# Patient Record
Sex: Male | Born: 1950 | Race: Black or African American | Hispanic: No | Marital: Married | Smoking: Former smoker
Health system: Southern US, Community
[De-identification: ages and names within clinical notes are randomized; demographics above are authoritative.]

## PROBLEM LIST (undated history)

## (undated) DIAGNOSIS — C099 Malignant neoplasm of tonsil, unspecified: Secondary | ICD-10-CM

## (undated) HISTORY — DX: Malignant neoplasm of tonsil, unspecified: C09.9

---

## 2014-05-14 ENCOUNTER — Ambulatory Visit
Admit: 2014-05-14 | Disposition: A | Discharge status: Home / Self Care | Payer: Self-pay | Attending: Oncology | Admitting: Oncology

## 2014-06-04 ENCOUNTER — Ambulatory Visit
Admit: 2014-06-04 | Disposition: A | Discharge status: Home / Self Care | Payer: Self-pay | Attending: Oncology | Admitting: Oncology

## 2014-06-05 ENCOUNTER — Ambulatory Visit
Admit: 2014-06-05 | Disposition: A | Discharge status: Home / Self Care | Payer: Self-pay | Attending: Oncology | Admitting: Oncology

## 2014-06-11 ENCOUNTER — Ambulatory Visit
Admit: 2014-06-11 | Disposition: A | Discharge status: Home / Self Care | Payer: Self-pay | Attending: Gastroenterology | Admitting: Gastroenterology

## 2014-06-24 ENCOUNTER — Ambulatory Visit: Admit: 2014-06-24 | Disposition: A | Discharge status: Home / Self Care | Payer: Self-pay | Admitting: Gastroenterology

## 2014-07-02 ENCOUNTER — Other Ambulatory Visit: Payer: Self-pay | Admitting: Oncology

## 2014-07-02 DIAGNOSIS — C099 Malignant neoplasm of tonsil, unspecified: Secondary | ICD-10-CM

## 2014-07-08 ENCOUNTER — Other Ambulatory Visit: Payer: Self-pay | Admitting: Family Medicine

## 2014-07-08 DIAGNOSIS — C4492 Squamous cell carcinoma of skin, unspecified: Secondary | ICD-10-CM

## 2014-07-09 ENCOUNTER — Other Ambulatory Visit: Payer: Self-pay | Admitting: Family Medicine

## 2014-07-09 ENCOUNTER — Ambulatory Visit: Payer: Federal, State, Local not specified - PPO

## 2014-07-09 DIAGNOSIS — C4492 Squamous cell carcinoma of skin, unspecified: Secondary | ICD-10-CM

## 2014-07-10 ENCOUNTER — Other Ambulatory Visit: Payer: Self-pay | Admitting: *Deleted

## 2014-07-10 DIAGNOSIS — C760 Malignant neoplasm of head, face and neck: Secondary | ICD-10-CM

## 2014-07-13 ENCOUNTER — Ambulatory Visit: Payer: Self-pay | Admitting: Oncology

## 2014-07-13 ENCOUNTER — Other Ambulatory Visit: Payer: Self-pay

## 2014-07-17 ENCOUNTER — Telehealth: Payer: Self-pay | Admitting: *Deleted

## 2014-07-17 NOTE — Telephone Encounter (Signed)
Patient stated that had recent studies done at [facility] that showed liver spot. Was questioning if the PET scan would show this spot to be cancerous. Informed pt that the PET scan would reveal if this spot is malignant or not. Pt stated keep appointment for the scan and will follow up with our clinic on Tuesday.

## 2014-07-21 ENCOUNTER — Ambulatory Visit
Admission: RE | Admit: 2014-07-21 | Discharge: 2014-07-21 | Disposition: A | Discharge status: Home / Self Care | Payer: Federal, State, Local not specified - PPO | Source: Ambulatory Visit | Attending: Family Medicine | Admitting: Family Medicine

## 2014-07-21 DIAGNOSIS — C801 Malignant (primary) neoplasm, unspecified: Secondary | ICD-10-CM | POA: Diagnosis present

## 2014-07-21 LAB — GLUCOSE, CAPILLARY: GLUCOSE-CAPILLARY: 107 mg/dL — AB (ref 65–99)

## 2014-07-21 MED ORDER — FLUDEOXYGLUCOSE F - 18 (FDG) INJECTION
12.2800 | Freq: Once | INTRAVENOUS | Status: AC | PRN
  Administered 2014-07-21: 12.28 via INTRAVENOUS

## 2014-07-23 ENCOUNTER — Encounter (INDEPENDENT_AMBULATORY_CARE_PROVIDER_SITE_OTHER): Payer: Self-pay

## 2014-07-23 ENCOUNTER — Inpatient Hospital Stay: Payer: Federal, State, Local not specified - PPO | Attending: Oncology

## 2014-07-23 ENCOUNTER — Inpatient Hospital Stay (HOSPITAL_BASED_OUTPATIENT_CLINIC_OR_DEPARTMENT_OTHER): Payer: Federal, State, Local not specified - PPO | Admitting: Oncology

## 2014-07-23 DIAGNOSIS — Z87891 Personal history of nicotine dependence: Secondary | ICD-10-CM

## 2014-07-23 DIAGNOSIS — Z800 Family history of malignant neoplasm of digestive organs: Secondary | ICD-10-CM | POA: Diagnosis not present

## 2014-07-23 DIAGNOSIS — Z79899 Other long term (current) drug therapy: Secondary | ICD-10-CM | POA: Insufficient documentation

## 2014-07-23 DIAGNOSIS — Z9221 Personal history of antineoplastic chemotherapy: Secondary | ICD-10-CM

## 2014-07-23 DIAGNOSIS — R160 Hepatomegaly, not elsewhere classified: Secondary | ICD-10-CM

## 2014-07-23 DIAGNOSIS — Z85858 Personal history of malignant neoplasm of other endocrine glands: Secondary | ICD-10-CM

## 2014-07-23 DIAGNOSIS — I10 Essential (primary) hypertension: Secondary | ICD-10-CM | POA: Insufficient documentation

## 2014-07-23 DIAGNOSIS — B1920 Unspecified viral hepatitis C without hepatic coma: Secondary | ICD-10-CM | POA: Insufficient documentation

## 2014-07-23 DIAGNOSIS — Z923 Personal history of irradiation: Secondary | ICD-10-CM | POA: Insufficient documentation

## 2014-07-23 DIAGNOSIS — Z85819 Personal history of malignant neoplasm of unspecified site of lip, oral cavity, and pharynx: Secondary | ICD-10-CM | POA: Insufficient documentation

## 2014-07-23 DIAGNOSIS — R945 Abnormal results of liver function studies: Secondary | ICD-10-CM | POA: Insufficient documentation

## 2014-07-23 DIAGNOSIS — K769 Liver disease, unspecified: Secondary | ICD-10-CM

## 2014-07-23 DIAGNOSIS — C099 Malignant neoplasm of tonsil, unspecified: Secondary | ICD-10-CM

## 2014-07-23 DIAGNOSIS — C760 Malignant neoplasm of head, face and neck: Secondary | ICD-10-CM

## 2014-07-23 LAB — COMPREHENSIVE METABOLIC PANEL
ALBUMIN: 4.2 g/dL (ref 3.5–5.0)
ALT: 78 U/L — ABNORMAL HIGH (ref 17–63)
ANION GAP: 4 — AB (ref 5–15)
AST: 59 U/L — ABNORMAL HIGH (ref 15–41)
Alkaline Phosphatase: 62 U/L (ref 38–126)
BUN: 12 mg/dL (ref 6–20)
CALCIUM: 9.2 mg/dL (ref 8.9–10.3)
CO2: 28 mmol/L (ref 22–32)
Chloride: 106 mmol/L (ref 101–111)
Creatinine, Ser: 0.98 mg/dL (ref 0.61–1.24)
GFR calc Af Amer: >60 mL/min (ref >60)
GFR calc non Af Amer: >60 mL/min (ref >60)
GLUCOSE: 104 mg/dL — AB (ref 65–99)
Potassium: 4.1 mmol/L (ref 3.5–5.1)
SODIUM: 138 mmol/L (ref 135–145)
TOTAL PROTEIN: 7.5 g/dL (ref 6.5–8.1)
Total Bilirubin: 1 mg/dL (ref 0.3–1.2)

## 2014-07-23 LAB — CBC WITH DIFFERENTIAL/PLATELET
Basophils Absolute: 0 10*3/uL (ref 0–0.1)
Basophils Relative: 1 %
EOS ABS: 0.1 10*3/uL (ref 0–0.7)
EOS PCT: 2 %
HCT: 49.8 % (ref 40.0–52.0)
HEMOGLOBIN: 16.7 g/dL (ref 13.0–18.0)
LYMPHS ABS: 0.8 10*3/uL — AB (ref 1.0–3.6)
LYMPHS PCT: 20 %
MCH: 31.9 pg (ref 26.0–34.0)
MCHC: 33.6 g/dL (ref 32.0–36.0)
MCV: 94.9 fL (ref 80.0–100.0)
Monocytes Absolute: 0.7 10*3/uL (ref 0.2–1.0)
Monocytes Relative: 17 %
Neutro Abs: 2.5 10*3/uL (ref 1.4–6.5)
Neutrophils Relative %: 60 %
PLATELETS: 129 10*3/uL — AB (ref 150–440)
RBC: 5.25 MIL/uL (ref 4.40–5.90)
RDW: 13.3 % (ref 11.5–14.5)
WBC: 4.1 10*3/uL (ref 3.8–10.6)

## 2014-07-24 ENCOUNTER — Encounter: Payer: Self-pay | Admitting: Oncology

## 2014-07-24 DIAGNOSIS — C099 Malignant neoplasm of tonsil, unspecified: Secondary | ICD-10-CM

## 2014-07-24 HISTORY — DX: Malignant neoplasm of tonsil, unspecified: C09.9

## 2014-07-24 NOTE — Progress Notes (Signed)
[place] @ [facility] Telephone:(336) [phone]  Fax:(336) [phone]     Walter Mays OB: 1950-03-11  MR#: [id number]  EYC#:[id number]  Patient Care Team: Arlis Porta., MD as PCP - General (Family Medicine)  CHIEF COMPLAINT: No chief complaint on file.   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  Treated initially with 1 cycle of Taxotere, cis-platinum, 5-FU by continuous infusion.  ([place], 2006) because of side effect patient went to [place] where patient underwent radical neck dissection with radiation therapy.  (Patient also received  CETUXAMAB  with radiation treatment) 2.  Routine evaluation in 2014  revealed   right   TONSILLAR abnormality, patient underwent robotic resection.  Exact stage of this cancer is not known. 3.  May of 2016.  Liver mass.  Low hypermetabolic. AFP OF more than 1400 suggestive of hepatocellular cancer.  Radiofrequency ablation is being planned     Carcinoma of tonsil   07/24/2014 Initial Diagnosis Carcinoma of tonsil    No flowsheet data found.  INTERVAL HISTORY: 64 year old gentleman was being followed by multiple physician.  Recently recent had evaluation with GI physician because of patient's hepatitis C routines ultrasound followed by MRI scan of liver was done.  A liver mass was found.  Patient had AFB of more than 1400 and patient was referred to [facility] for radiofrequency ablation.  Patient recently had developed PET scan done for the restaging of head and neck cancer and here to discuss the results and further planning of treatment.  Patient did not have colonoscopy in last few years.  Appetite remains stable.  No abdominal pain.  REVIEW OF SYSTEMS:   GENERAL:  Feels good.  Active.  No fevers, sweats or weight loss. PERFORMANCE STATUS (ECOG): 0 HEENT:  No visual changes, runny nose, sore throat, mouth sores or tenderness. Lungs: No  shortness of breath or cough.  No hemoptysis. Cardiac:  No chest pain, palpitations, orthopnea, or PND. GI:  No nausea, vomiting, diarrhea, constipation, melena or hematochezia. GU:  No urgency, frequency, dysuria, or hematuria. Musculoskeletal:  No back pain.  No joint pain.  No muscle tenderness. Extremities:  No pain or swelling. Skin:  No rashes or skin changes. Neuro:  No headache, numbness or weakness, balance or coordination issues. Endocrine:  No diabetes, thyroid issues, hot flashes or night sweats. Psych:  No mood changes, depression or anxiety. Pain:  No focal pain. Review of systems:  All other systems reviewed and found to be negative.  As per HPI. Otherwise, a complete review of systems is negatve.  PAST MEDICAL HISTORY: Past Medical History  Diagnosis Date  . Carcinoma of tonsil 07/24/2014    PAST history. Significant History/PMH:   head and neck cancer:    Hypertension:  Hepatitis C  Smoking History: Smoking History Never Smoked.  PFSH: Comments: colon cancer in the family.  History of diabetes in the family.  Social History: negative alcohol, negative tobacco  Additional Past Medical and Surgical History: History of hypertension  Unspecified liver disease (history of hepatitis)  Kidney stones    No significant family history     ADVANCED DIRECTIVES: has advance care directives   HEALTH MAINTENANCE: History  Substance Use Topics  . Smoking status: Former Research scientist (life sciences)  . Smokeless tobacco: Not on file  . Alcohol Use: Not on file     Colonoscopy:  PAP:  Bone density:  Lipid panel:  Allergies  Allergen Reactions  .  Ultracet [Tramadol-Acetaminophen] Itching    Current Outpatient Prescriptions  Medication Sig Dispense Refill  . amLODipine (NORVASC) 5 MG tablet     . sertraline (ZOLOFT) 50 MG tablet      No current facility-administered medications for this visit.    OBJECTIVE:  There were no vitals filed for this visit.   There is no  weight on file to calculate BMI.    ECOG FS:0 - Asymptomatic  PHYSICAL EXAM: GENERAL:  Well developed, well nourished, sitting comfortably in the exam room in no acute distress. MENTAL STATUS:  Alert and oriented to person, place and time. HEA Normocephalic, atraumatic, face symmetric, no Cushingoid features. EYES:Pupils equal round and reactive to light and accomodation.  No conjunctivitis or scleral icterus. ENT:  Oropharynx clear without lesion.  Tongue normal. Mucous membranes moist.  RESPIRATORY:  Clear to auscultation without rales, wheezes or rhonchi. CARDIOVASCULAR:  Regular rate and rhythm without murmur, rub or gallop. BREAST:  Right breast without masses, skin changes or nipple discharge.  Left breast without masses, skin changes or nipple discharge. ABDOMEN:  Soft, non-tender, with active bowel sounds, and no hepatosplenomegaly.  No masses. BACK:  No CVA tenderness.  No tenderness on percussion of the back or rib cage. SKIN:  No rashes, ulcers or lesions. EXTREMITIES: No edema, no skin discoloration or tenderness.  No palpable cords. LYMPH NODES: No palpable cervical, supraclavicular, axillary or inguinal adenopathy  NEUROLOGICAL: Unremarkable. PSYCH:  Appropriate.   LAB RESULTS:  Clinical Support on 07/23/2014  Component Date Value Ref Range Status  . WBC 07/23/2014 4.1  3.8 - 10.6 K/uL Final  . RBC 07/23/2014 5.25  4.40 - 5.90 MIL/uL Final  . Hemoglobin 07/23/2014 16.7  13.0 - 18.0 g/dL Final  . HCT 07/23/2014 49.8  40.0 - 52.0 % Final  . MCV 07/23/2014 94.9  80.0 - 100.0 fL Final  . MCH 07/23/2014 31.9  26.0 - 34.0 pg Final  . MCHC 07/23/2014 33.6  32.0 - 36.0 g/dL Final  . RDW 07/23/2014 13.3  11.5 - 14.5 % Final  . Platelets 07/23/2014 129* 150 - 440 K/uL Final  . Neutrophils Relative % 07/23/2014 60   Final  . Neutro Abs 07/23/2014 2.5  1.4 - 6.5 K/uL Final  . Lymphocytes Relative 07/23/2014 20   Final  . Lymphs Abs 07/23/2014 0.8* 1.0 - 3.6 K/uL Final  .  Monocytes Relative 07/23/2014 17   Final  . Monocytes Absolute 07/23/2014 0.7  0.2 - 1.0 K/uL Final  . Eosinophils Relative 07/23/2014 2   Final  . Eosinophils Absolute 07/23/2014 0.1  0 - 0.7 K/uL Final  . Basophils Relative 07/23/2014 1   Final  . Basophils Absolute 07/23/2014 0.0  0 - 0.1 K/uL Final  . Sodium 07/23/2014 138  135 - 145 mmol/L Final  . Potassium 07/23/2014 4.1  3.5 - 5.1 mmol/L Final  . Chloride 07/23/2014 106  101 - 111 mmol/L Final  . CO2 07/23/2014 28  22 - 32 mmol/L Final  . Glucose, Bld 07/23/2014 104* 65 - 99 mg/dL Final  . BUN 07/23/2014 12  6 - 20 mg/dL Final  . Creatinine, Ser 07/23/2014 0.98  0.61 - 1.24 mg/dL Final  . Calcium 07/23/2014 9.2  8.9 - 10.3 mg/dL Final  . Total Protein 07/23/2014 7.5  6.5 - 8.1 g/dL Final  . Albumin 07/23/2014 4.2  3.5 - 5.0 g/dL Final  . AST 07/23/2014 59* 15 - 41 U/L Final  . ALT 07/23/2014 78* 17 - 63 U/L Final  .  Alkaline Phosphatase 07/23/2014 62  38 - 126 U/L Final  . Total Bilirubin 07/23/2014 1.0  0.3 - 1.2 mg/dL Final  . GFR calc non Af Amer 07/23/2014 >60  >60 mL/min Final  . GFR calc Af Amer 07/23/2014 >60  >60 mL/min Final   Comment: (NOTE) The eGFR has been calculated using the CKD EPI equation. This calculation has not been validated in all clinical situations. eGFR's persistently <60 mL/min signify possible Chronic Kidney Disease.   Georgiann Hahn gap 07/23/2014 4* 5 - 15 Final  Hospital Outpatient Visit on 07/21/2014  Component Date Value Ref Range Status  . Glucose-Capillary 07/21/2014 107* 65 - 99 mg/dL Final    No results found for: LABCA2 No results found for: CA199 No results found for: CEA No results found for: PSA No results found for: CA125   STUDIES: Mr Abdomen W Wo Contrast  06/24/2014   CLINICAL DATA:  Subsequent encounter for liver lesion  EXAM: MRI ABDOMEN WITHOUT AND WITH CONTRAST  TECHNIQUE: Multiplanar multisequence MR imaging of the abdomen was performed both before and after the  administration of intravenous contrast.  CONTRAST:  18 cc MultiHance  COMPARISON:  Ultrasound exam 06/11/2014.  FINDINGS: Lower chest:  Unremarkable.  Hepatobiliary: Heterogeneously enhancing 4.1 x 3.5 x 4.0 cm masses identified in the inferior liver (segment V). This corresponds to the abnormality seen on the recent ultrasound exam. The lesion exists in 8 liver which shows nodular enhancement in atrophy of the left hepatic lobe. Left hepatic lobe appears more nodular than the right liver. Multiple hyper enhancing tiny foci are seen scattered throughout both lobes.  Gallbladder is distended with some dependent stones measuring in the 5-10 mm size range. No pericholecystic fluid or gallbladder wall thickening. No intrahepatic or extrahepatic biliary dilation.  Pancreas: No focal mass lesion. No dilatation of the main duct. No intraparenchymal cyst. No peripancreatic edema.  Spleen: No splenomegaly. No focal mass lesion.  Adrenals/Urinary Tract: No adrenal nodule or mass. Kidneys are unremarkable.  Stomach/Bowel: Stomach is nondistended. No gastric wall thickening. No evidence of outlet obstruction. Duodenum is normally positioned as is the ligament of Treitz.  Vascular/Lymphatic: No abdominal aortic aneurysm. No gastrohepatic or hepatoduodenal ligament lymphadenopathy. No retroperitoneal lymphadenopathy.  Other: No intraperitoneal free fluid in the visualized abdomen.  Musculoskeletal: No abnormal marrow signal within the visualized bony anatomy.  IMPRESSION: 4.0 cm heterogeneously enhancing lesion in the inferior right liver. Given the hepatic appearance suggesting underlying cirrhosis in this patient with a history of hepatitis-C, hepatoma is a distinct concern. Other scattered tiny (5-30m) foci of hyper enhancement raise concern for multi focal disease.  Cholelithiasis.   Electronically Signed   By: EMisty StanleyM.D.   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 Liver lesion on MRI.  EXAM: NUCLEAR MEDICINE PET SKULL BASE TO THIGH  TECHNIQUE: 12.3 mCi F-18 FDG was injected intravenously. Full-ring PET imaging was performed from the skull base to thigh after the radiotracer. CT data was obtained and used for attenuation correction and anatomic localization.  FASTING BLOOD GLUCOSE:  Value: 107 mg/dl  COMPARISON:  None.  FINDINGS: NECK  No hypermetabolic lymph nodes in the neck.  CHEST  Lungs are clear. No suspicious pulmonary nodules. Mild dependent atelectasis in the bilateral lower lobes.  Heart is normal in size.  No suspicious thoracic lymphadenopathy.  ABDOMEN/PELVIS  Although poorly visualized on unenhanced  CT, the known lesion in the anterior right liver measures approximately 3.9 x 3.8 cm (series 3/image 128). No associated hypermetabolism above background liver on FDG PET. Underlying cirrhosis.  No abnormal hypermetabolic activity within the pancreas, adrenal glands, or spleen.  Cholelithiasis, without associated inflammatory changes.  No hypermetabolic lymph nodes in the abdomen or pelvis.  SKELETON  No focal hypermetabolic activity to suggest skeletal metastasis.  IMPRESSION: Known liver lesion in the anterior right hepatic lobe does not demonstrate hypermetabolism above that of hepatic background. While this does not exclude low grade hepatocellular carcinoma, the appearance is not suggestive of metastatic disease. Consider percutaneous sampling.  Otherwise, no evidence of metastatic disease in the neck, chest, abdomen, or pelvis.   Electronically Signed   By: Julian Hy M.D.   On: 07/21/2014 11:18    ASSESSMENT: Carcinoma of tonsils and previous history of carcinoma metastases to lymph node (squamous cell) cervical lymph node unknown primary treated at outside institution.  PET scan has been done and has been reviewed independently there is no evidence of recurrent head and neck  cancer 2.  History of hepatitis C and recent MRI scan of liver sows liver mass with high alpha-fetoprotein  MEDICAL DECISION MAKING:  PET scan has been reviewed and reviewed with the patient.  Liver masses is hypermetabolic but not very active.  Alpha-fetoprotein is high.  Suggesting possibility of liver primary cancer.  Patient has an appointment to see GI department at [facility] for radiofrequency ablation.  We are making arrangements for colonoscopy down the road.  Patient was explained there is no evidence of recurrent head and neck cancer but possibility of primary liver cancer is strong.  Patient will follow-up in 6 month  Patient expressed understanding and was in agreement with this plan. He also understands that He can call clinic at any time with any questions, concerns, or complaints.    No matching staging information was found for the patient.  Forest Gleason, MD   07/24/2014 12:07 PM

## 2014-09-01 ENCOUNTER — Telehealth: Payer: Self-pay | Admitting: Gastroenterology

## 2014-09-01 NOTE — Telephone Encounter (Signed)
Pt wife is concerned about oxycodiene is making her husband make bad decisions. She would like to speak to a nurse so this can be documented ASAP

## 2014-09-01 NOTE — Telephone Encounter (Signed)
Returned patient call. Talked with patient's wife and discovered that she had the wrong number and was trying to call [facility]. Patient's wife apologized for calling the wrong number.

## 2014-09-11 ENCOUNTER — Telehealth: Payer: Self-pay

## 2014-09-11 NOTE — Telephone Encounter (Signed)
Patient left me VM regarding concerns that he could not reach [facility] Dr. Jeani Sow for his Liver follow up. They found a Tumor a few weeks ago and removed 2/3 and he was admitting to being very scared and having increased dizzy spells and just not feeling well. He called office and no one will return calls. I called him and he had finally reached them and was worked in there for this morning. He said they are going to draw labs today to see how things are and why he is so dizzy but that they also plan to start Localized Chemotherapy to try and kill the 2/3 that was left of the Tumor that they did say is cancerous. I advised him to call us if he has an issues or if he needs anything at all. Patient sounded very unsure of how this will turn out and said that the prognosis does not sound good and he was very worried. I told him to be strong and if he needs anything at all to call us. He was very Patent attorney. [facility]

## 2014-09-14 NOTE — Telephone Encounter (Signed)
Noted-jh 

## 2014-09-24 ENCOUNTER — Inpatient Hospital Stay: Payer: Federal, State, Local not specified - PPO | Admitting: Oncology

## 2014-09-24 ENCOUNTER — Inpatient Hospital Stay: Payer: Federal, State, Local not specified - PPO | Attending: Oncology

## 2015-02-02 ENCOUNTER — Other Ambulatory Visit: Payer: Self-pay | Admitting: Family Medicine

## 2015-02-26 ENCOUNTER — Ambulatory Visit (INDEPENDENT_AMBULATORY_CARE_PROVIDER_SITE_OTHER): Payer: Federal, State, Local not specified - PPO

## 2015-02-26 VITALS — BP 150/80 | HR 86 | Resp 16 | Wt 188.4 lb

## 2015-02-26 DIAGNOSIS — Z23 Encounter for immunization: Secondary | ICD-10-CM

## 2015-03-11 ENCOUNTER — Encounter: Payer: Self-pay | Admitting: Family Medicine

## 2015-04-08 ENCOUNTER — Telehealth: Payer: Self-pay | Admitting: Family Medicine

## 2015-04-08 NOTE — Telephone Encounter (Signed)
Give him info about [facility] or [facility], esp. [place].  I see no contraindication to taking Zoloft with Epclusa.-jh

## 2015-04-08 NOTE — Telephone Encounter (Signed)
Pt called requesting a referral to  [place], he also have a question about med's , he states that his liver Dr gave him  Raeanne Gathers and he was taking  Zoloft 50mg  he wanted to know if that okay to take both meds pt call back # is  [phone]

## 2015-04-08 NOTE — Telephone Encounter (Signed)
Pt advised as per Dr. Luan Pulling and the phone number for [facility] [phone] and Ellender Hose [phone] were given to the pt.

## 2015-05-07 ENCOUNTER — Other Ambulatory Visit: Payer: Self-pay | Admitting: Family Medicine

## 2015-06-17 ENCOUNTER — Other Ambulatory Visit: Payer: Self-pay | Admitting: Family Medicine

## 2015-06-26 ENCOUNTER — Other Ambulatory Visit: Payer: Self-pay | Admitting: Family Medicine

## 2015-07-30 ENCOUNTER — Other Ambulatory Visit: Payer: Self-pay | Admitting: Family Medicine

## 2015-08-12 ENCOUNTER — Other Ambulatory Visit: Payer: Self-pay | Admitting: Family Medicine

## 2015-09-11 ENCOUNTER — Other Ambulatory Visit: Payer: Self-pay | Admitting: Family Medicine

## 2015-10-27 ENCOUNTER — Other Ambulatory Visit: Payer: Self-pay | Admitting: Family Medicine

## 2015-11-23 ENCOUNTER — Other Ambulatory Visit: Payer: Self-pay | Admitting: Family Medicine

## 2016-01-04 ENCOUNTER — Encounter: Payer: Self-pay | Admitting: Family Medicine

## 2016-01-04 ENCOUNTER — Ambulatory Visit (INDEPENDENT_AMBULATORY_CARE_PROVIDER_SITE_OTHER): Payer: Federal, State, Local not specified - PPO | Admitting: Family Medicine

## 2016-01-04 DIAGNOSIS — I10 Essential (primary) hypertension: Secondary | ICD-10-CM | POA: Diagnosis not present

## 2016-01-04 DIAGNOSIS — C220 Liver cell carcinoma: Secondary | ICD-10-CM

## 2016-01-04 DIAGNOSIS — B182 Chronic viral hepatitis C: Secondary | ICD-10-CM

## 2016-01-04 DIAGNOSIS — B1920 Unspecified viral hepatitis C without hepatic coma: Secondary | ICD-10-CM | POA: Insufficient documentation

## 2016-01-04 MED ORDER — AMLODIPINE BESYLATE 2.5 MG PO TABS: 2.5000 mg | ORAL_TABLET | Freq: Every day | ORAL | 6 refills | Status: AC

## 2016-01-04 NOTE — Progress Notes (Signed)
Name: Walter Mays   MRN: [id number]    DOB: 10/17/1950   Date:01/04/2016       Progress Note  Subjective  Chief Complaint  Chief Complaint  Patient presents with  . Follow-up    liver cancer    HPI Here to discuss possible use of marijuana for his liver cancer and nausea and and pain from his cancer.  He is in a clinical trial at [facility] for treatment of his cancer since traditional treatments have failed. No problem-specific Assessment & Plan notes found for this encounter.   Past Medical History:  Diagnosis Date  . Carcinoma of tonsil ([place]) 07/24/2014    History reviewed. No pertinent surgical history.  History reviewed. No pertinent family history.  Social History   Social History  . Marital status: Married    Spouse name: N/A  . Number of children: N/A  . Years of education: N/A   Occupational History  . Not on file.   Social History Main Topics  . Smoking status: Former Research scientist (life sciences)  . Smokeless tobacco: Former Systems developer  . Alcohol use No  . Drug use:     Types: Marijuana  . Sexual activity: Not Currently   Other Topics Concern  . Not on file   Social History Narrative  . No narrative on file     Current Outpatient Prescriptions:  .  amLODipine (NORVASC) 2.5 MG tablet, Take 1 tablet (2.5 mg total) by mouth daily., Disp: 30 tablet, Rfl: 6 .  docusate sodium (COLACE) 100 MG capsule, Take 100 mg by mouth., Disp: , Rfl:  .  HYDROmorphone (DILAUDID) 2 MG tablet, Take 2 mg by mouth., Disp: , Rfl:  .  ondansetron (ZOFRAN) 4 MG tablet, , Disp: , Rfl:  .  sertraline (ZOLOFT) 50 MG tablet, Take 1 tablet (50 mg total) by mouth daily., Disp: 30 tablet, Rfl: 6 .  sildenafil (REVATIO) 20 MG tablet, 2-5 TABLETS AS NEEDED 1 HOUR PRIOR TO INTERCOURSE., Disp: , Rfl:  .  zolpidem (AMBIEN) 5 MG tablet, Take 5 mg by mouth., Disp: , Rfl:   Allergies  Allergen Reactions  . Ultracet [Tramadol-Acetaminophen] Itching     Review of Systems  Constitutional: Negative  for chills, fever, malaise/fatigue and weight loss.  HENT: Negative for hearing loss.   Eyes: Negative for blurred vision and double vision.  Respiratory: Negative for cough, shortness of breath and wheezing.   Cardiovascular: Negative for chest pain, palpitations and leg swelling.  Gastrointestinal: Positive for abdominal pain, constipation and nausea. Negative for blood in stool, heartburn and vomiting.  Genitourinary: Negative for dysuria, frequency and urgency.  Musculoskeletal: Negative for myalgias.  Skin: Negative for rash.  Neurological: Negative for dizziness, tremors, weakness and headaches.      Objective  Vitals:   01/04/16 0957 01/04/16 1108  BP: 116/72 105/60  Pulse: 93   Temp: 99.6 F (37.6 C)   TempSrc: Oral   Weight: 168 lb (76.2 kg)   Height: _0  (1.778 m)     Physical Exam  Constitutional: He is oriented to person, place, and time and well-developed, well-nourished, and in no distress. No distress.  HENT:  Head: Normocephalic and atraumatic.  Eyes: Conjunctivae and EOM are normal. Pupils are equal, round, and reactive to light. No scleral icterus.  Neck: Normal range of motion. Neck supple. Carotid bruit is not present. No thyromegaly present.  Cardiovascular: Normal rate, regular rhythm and normal heart sounds.  Exam reveals no gallop and no friction rub.  No murmur heard. Pulmonary/Chest: Effort normal and breath sounds normal. No respiratory distress. He has no wheezes. He has no rales.  Abdominal: Soft. Bowel sounds are normal. He exhibits no distension and no mass. There is tenderness (RUQ).  Musculoskeletal: He exhibits no edema.  Lymphadenopathy:    He has no cervical adenopathy.  Neurological: He is alert and oriented to person, place, and time.  Vitals reviewed.      No results found for this or any previous visit (from the past 2160 hour(s)).   Assessment & Plan  Problem List Items Addressed This Visit      Cardiovascular and  Mediastinum   Essential hypertension   Relevant Medications   sildenafil (REVATIO) 20 MG tablet   amLODipine (NORVASC) 2.5 MG tablet     Digestive   Liver carcinoma (HCC)   Relevant Medications   ondansetron (ZOFRAN) 4 MG tablet   Hepatitis C    Other Visit Diagnoses   None.     Meds ordered this encounter  Medications  . docusate sodium (COLACE) 100 MG capsule    Sig: Take 100 mg by mouth.  Marland Kitchen HYDROmorphone (DILAUDID) 2 MG tablet    Sig: Take 2 mg by mouth.  . ondansetron (ZOFRAN) 4 MG tablet  . sildenafil (REVATIO) 20 MG tablet    Sig: 2-5 TABLETS AS NEEDED 1 HOUR PRIOR TO INTERCOURSE.  Marland Kitchen zolpidem (AMBIEN) 5 MG tablet    Sig: Take 5 mg by mouth.  Marland Kitchen amLODipine (NORVASC) 2.5 MG tablet    Sig: Take 1 tablet (2.5 mg total) by mouth daily.    Dispense:  30 tablet    Refill:  6   1. Liver carcinoma ([place]) Discussed his use of Marijuana for nausea and pain control.  Reminded him that it was not legal on [place], but that I understood his use of it.  Handicapped liscense form filled out and signed.  2. Chronic hepatitis C without hepatic coma ([place])   3. Essential hypertension  - amLODipine (NORVASC) 2.5 MG tablet; Take 1 tablet (2.5 mg total) by mouth daily.  Dispense: 30 tablet; Refill: 6 Ret 2 months

## 2016-03-09 ENCOUNTER — Ambulatory Visit: Payer: Federal, State, Local not specified - PPO | Admitting: Family Medicine

## 2016-03-15 ENCOUNTER — Other Ambulatory Visit: Payer: Self-pay | Admitting: Family Medicine

## 2016-03-21 IMAGING — CT NM PET TUM IMG RESTAG (PS) SKULL BASE T - THIGH
9 series · 25 of 25 positions shown · non-contrast
Comparison: None.

CLINICAL DATA: Subsequent treatment strategy for squamous cell
throat cancer. Liver lesion on MRI.

EXAM:
NUCLEAR MEDICINE PET SKULL BASE TO THIGH
TECHNIQUE: 12.3 mCi F-18 FDG was injected intravenously. Full-ring PET imaging
was performed from the skull base to thigh after the radiotracer. CT
data was obtained and used for attenuation correction and anatomic
localization.
FASTING BLOOD GLUCOSE:  Value: 107 mg/dl

[Series 3: ct wb 5.0 b30f · axial · 5.0mm · 0.98mm/px · z∈[-1432,-566]mm · 3 of 290 slices shown]
[im 1/290]
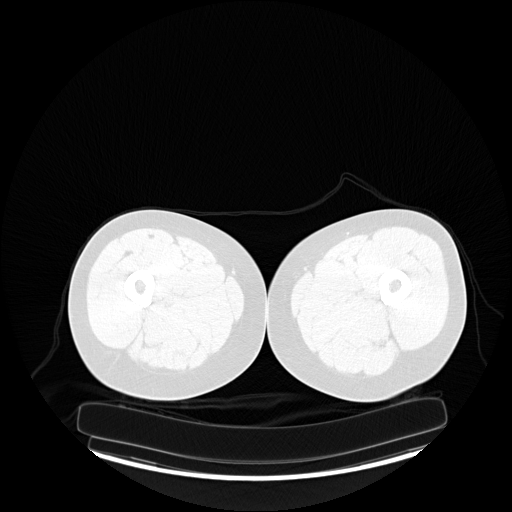
[im 145/290]
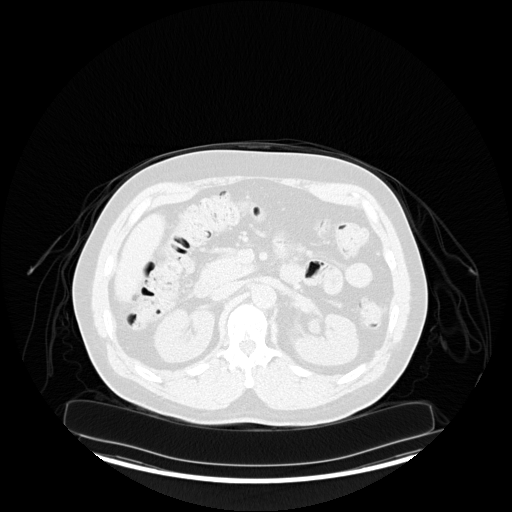
[im 290/290  brain]
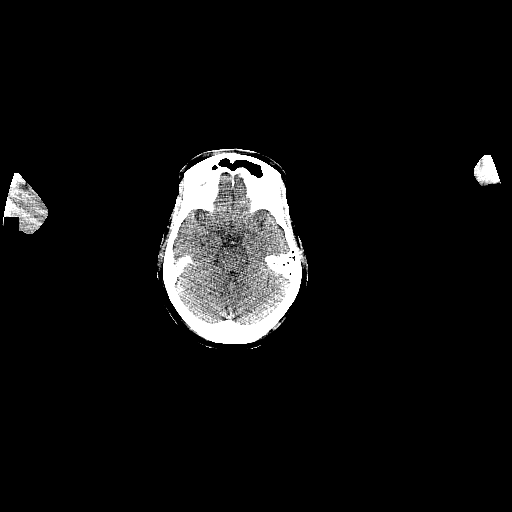

[Series 4: pet wb (ac) · axial · 5.0mm · 4.07mm/px · z∈[-1432,-566]mm · 4 of 290 slices shown]
[im 1/290]
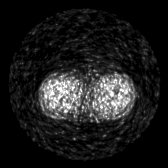
[im 97/290]
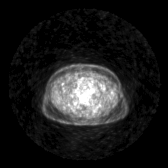
[im 193/290]
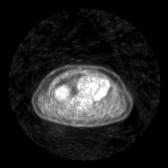
[im 290/290]
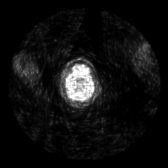

[Series 5: pet wb uncorrected (nac) · axial · 5.0mm · 4.07mm/px · z∈[-1432,-566]mm · 4 of 290 slices shown]
[im 1/290]
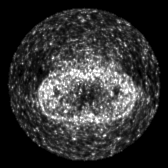
[im 97/290]
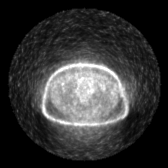
[im 193/290]
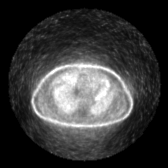
[im 290/290]
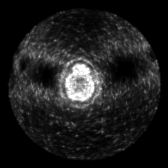

[Series 603: pet axial · 4 of 290 slices shown]
[im 1/290]
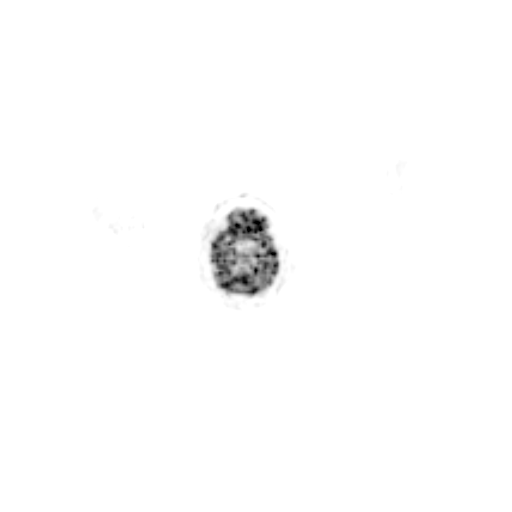
[im 97/290]
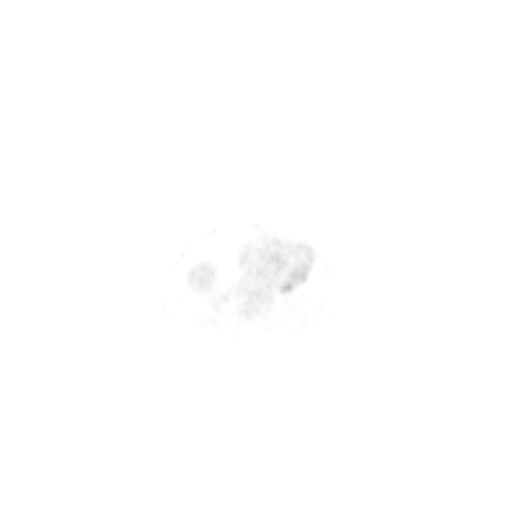
[im 193/290]
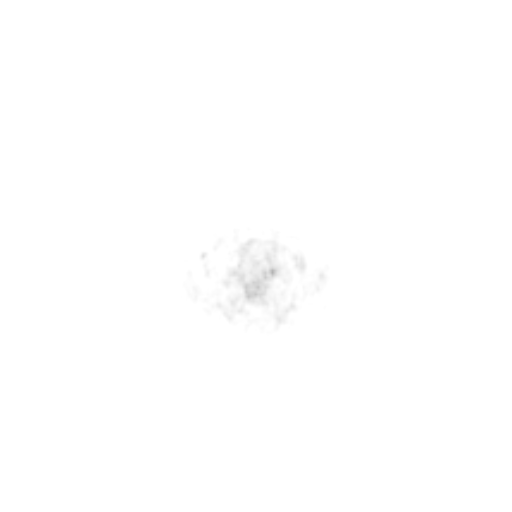
[im 290/290]
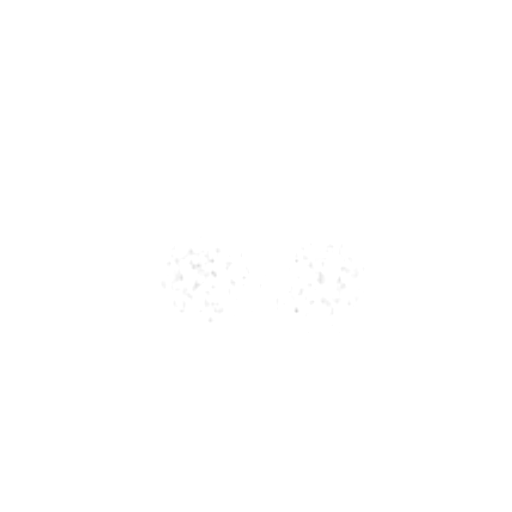

[Series 604: pet coronal · 1 of 77 slices shown]
[im 1/77]
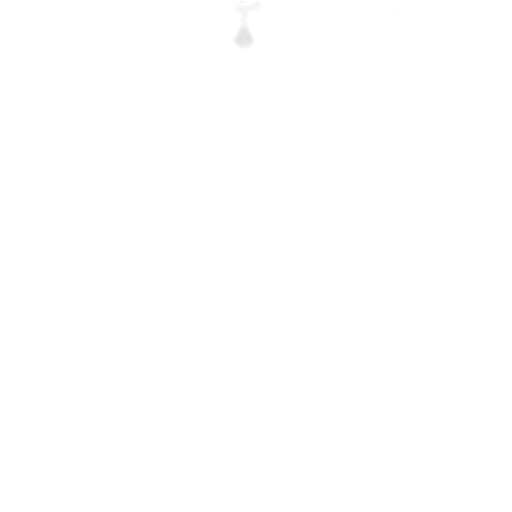

[Series 605: pet sagittal · 2 of 132 slices shown]
[im 1/132]
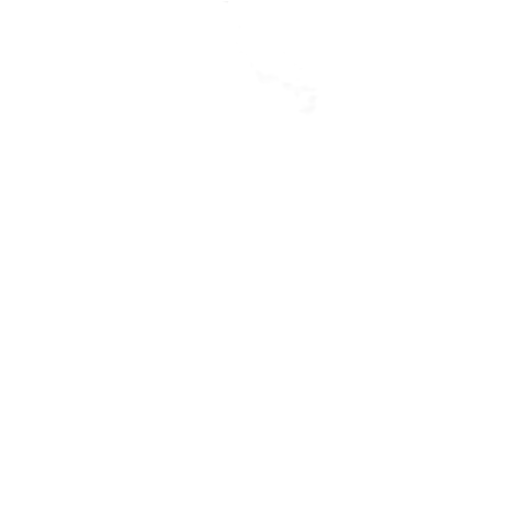
[im 132/132]
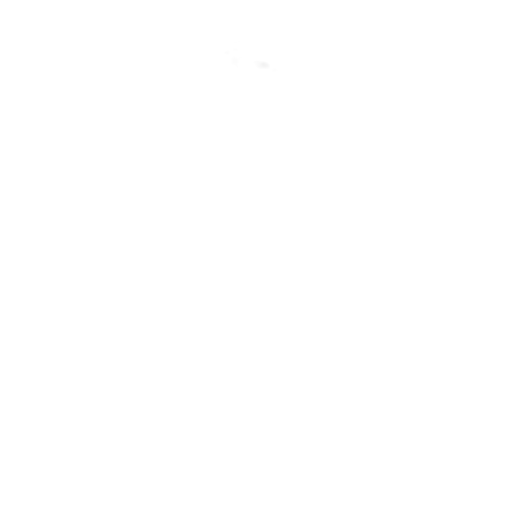

[Series 606: pet /ct axial · 4 of 284 slices shown]
[im 1/284]
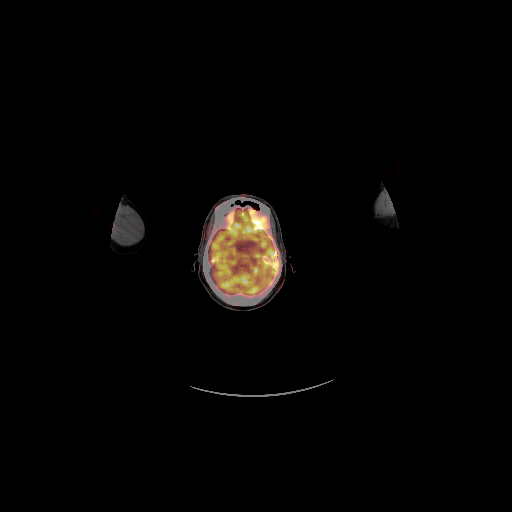
[im 95/284]
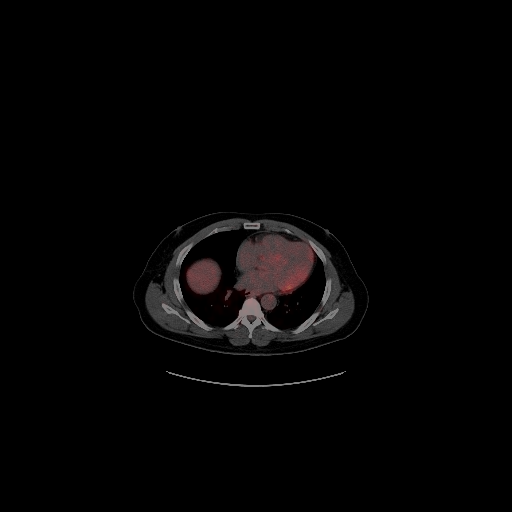
[im 189/284]
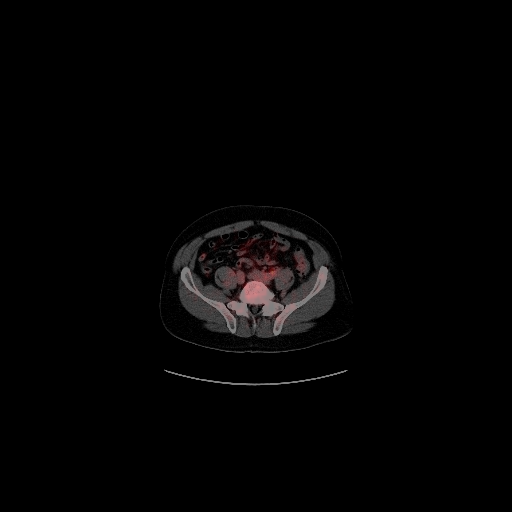
[im 284/284]
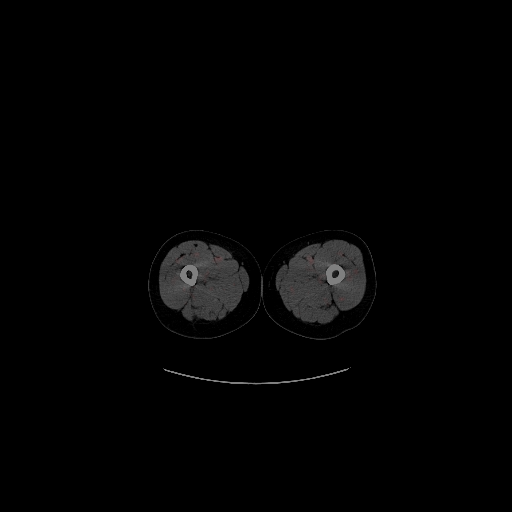

[Series 607: pet /ct coronal · 1 of 83 slices shown]
[im 1/83]
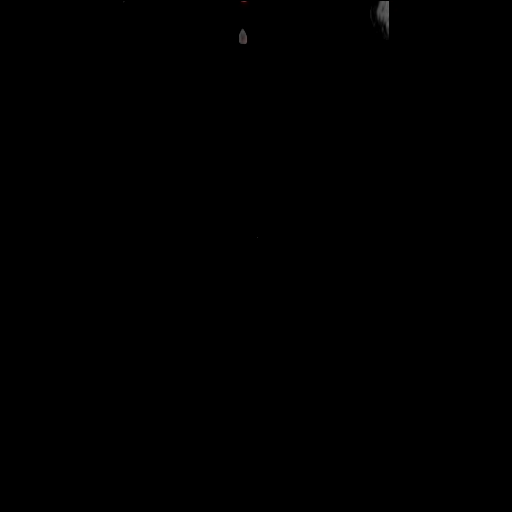

[Series 608: pet/ct sagittal · 2 of 130 slices shown]
[im 1/130]
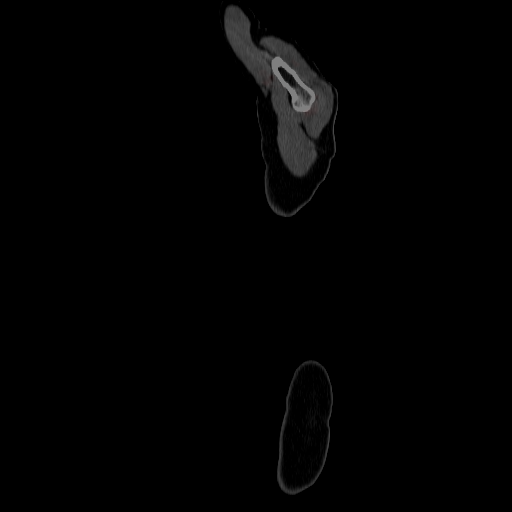
[im 130/130]
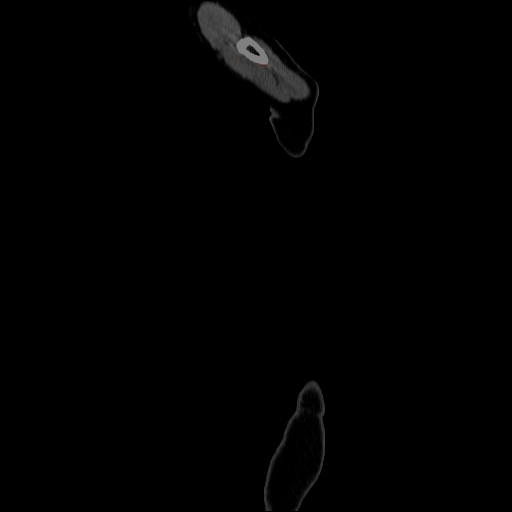

[25 of 25 positions shown; findings below may reference images not displayed]

FINDINGS: NECK

No hypermetabolic lymph nodes in the neck.

CHEST

Lungs are clear. No suspicious pulmonary nodules. Mild dependent
atelectasis in the bilateral lower lobes.

Heart is normal in size.

No suspicious thoracic lymphadenopathy.

ABDOMEN/PELVIS

Although poorly visualized on unenhanced CT, the known lesion in the
anterior right liver measures approximately 3.9 x 3.8 cm (series
3/image 128). No associated hypermetabolism above background liver
on FDG PET. Underlying cirrhosis.

No abnormal hypermetabolic activity within the pancreas, adrenal
glands, or spleen.

Cholelithiasis, without associated inflammatory changes.

No hypermetabolic lymph nodes in the abdomen or pelvis.

SKELETON

No focal hypermetabolic activity to suggest skeletal metastasis.
IMPRESSION: Known liver lesion in the anterior right hepatic lobe does not
demonstrate hypermetabolism above that of hepatic background. While
this does not exclude low grade hepatocellular carcinoma, the
appearance is not suggestive of metastatic disease. Consider
percutaneous sampling.

Otherwise, no evidence of metastatic disease in the neck, chest,
abdomen, or pelvis.

## 2016-04-06 DEATH — deceased
